# Patient Record
Sex: Female | Born: 2000 | Race: White | Hispanic: No | Marital: Single | Smoking: Never smoker
Health system: Southern US, Community
[De-identification: ages and names within clinical notes are randomized; demographics above are authoritative.]

## PROBLEM LIST (undated history)

## (undated) DIAGNOSIS — F32A Depression, unspecified: Secondary | ICD-10-CM

## (undated) DIAGNOSIS — F988 Other specified behavioral and emotional disorders with onset usually occurring in childhood and adolescence: Secondary | ICD-10-CM

## (undated) DIAGNOSIS — F419 Anxiety disorder, unspecified: Secondary | ICD-10-CM

## (undated) HISTORY — PX: TONSILLECTOMY: SUR1361

---
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
## 2020-07-04 ENCOUNTER — Emergency Department (HOSPITAL_COMMUNITY): Payer: BC Managed Care – PPO

## 2020-07-04 ENCOUNTER — Encounter (HOSPITAL_COMMUNITY): Payer: Self-pay

## 2020-07-04 ENCOUNTER — Emergency Department (HOSPITAL_COMMUNITY)
Admission: EM | Admit: 2020-07-04 | Discharge: 2020-07-04 | Disposition: A | Discharge status: Home / Self Care | Payer: BC Managed Care – PPO | Attending: Emergency Medicine | Admitting: Emergency Medicine

## 2020-07-04 DIAGNOSIS — Y9389 Activity, other specified: Secondary | ICD-10-CM | POA: Insufficient documentation

## 2020-07-04 DIAGNOSIS — J45909 Unspecified asthma, uncomplicated: Secondary | ICD-10-CM | POA: Diagnosis not present

## 2020-07-04 DIAGNOSIS — S82831A Other fracture of upper and lower end of right fibula, initial encounter for closed fracture: Secondary | ICD-10-CM | POA: Diagnosis not present

## 2020-07-04 DIAGNOSIS — M25561 Pain in right knee: Secondary | ICD-10-CM | POA: Diagnosis not present

## 2020-07-04 DIAGNOSIS — S8991XA Unspecified injury of right lower leg, initial encounter: Secondary | ICD-10-CM | POA: Diagnosis present

## 2020-07-04 DIAGNOSIS — W010XXA Fall on same level from slipping, tripping and stumbling without subsequent striking against object, initial encounter: Secondary | ICD-10-CM | POA: Insufficient documentation

## 2020-07-04 DIAGNOSIS — M25571 Pain in right ankle and joints of right foot: Secondary | ICD-10-CM | POA: Diagnosis not present

## 2020-07-04 DIAGNOSIS — R52 Pain, unspecified: Secondary | ICD-10-CM

## 2020-07-04 HISTORY — DX: Depression, unspecified: F32.A

## 2020-07-04 HISTORY — DX: Anxiety disorder, unspecified: F41.9

## 2020-07-04 HISTORY — DX: Other specified behavioral and emotional disorders with onset usually occurring in childhood and adolescence: F98.8

## 2020-07-04 MED ORDER — HYDROCODONE-ACETAMINOPHEN 5-325 MG PO TABS: 1.0000 | ORAL_TABLET | Freq: Four times a day (QID) | ORAL | 0 refills | Status: AC | PRN

## 2020-07-04 MED ORDER — ONDANSETRON HCL 4 MG/2ML IJ SOLN
4.0000 mg | Freq: Once | INTRAMUSCULAR | Status: DC
  Filled 2020-07-04: qty 2

## 2020-07-04 MED ORDER — FENTANYL CITRATE (PF) 100 MCG/2ML IJ SOLN
50.0000 ug | Freq: Once | INTRAMUSCULAR | Status: DC
  Filled 2020-07-04: qty 2

## 2020-07-04 MED ORDER — IBUPROFEN 600 MG PO TABS: 600.0000 mg | ORAL_TABLET | Freq: Four times a day (QID) | ORAL | 0 refills | Status: AC | PRN

## 2020-07-04 NOTE — Discharge Instructions (Signed)
You have a proximal fibular fracture, the rest of your x-rays are reassuring.  Please remain in knee immobilizer, and you should not bear any weight on the foot, use crutches.  Ice and elevate the leg.  Use ibuprofen 600 mg every 6 hours and Norco as needed every 6 hours for breakthrough pain.  Call tomorrow to schedule follow-up appointment with Dr. Ave Filter in [facility] orthopedics.

## 2020-07-04 NOTE — Progress Notes (Signed)
Orthopedic Tech Progress Note Patient Details:  Teresa Nicholson 12-02-2000 [id number]  Ortho Devices Type of Ortho Device: Knee Immobilizer, Crutches Ortho Device/Splint Location: Right Knee Ortho Device/Splint Interventions: Application, Adjustment   Post Interventions Patient Tolerated: Well Instructions Provided: Adjustment of device, Poper ambulation with device   Teresa Nicholson E Teresa Nicholson 07/04/2020, 5:04 AM
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
## 2021-02-17 ENCOUNTER — Other Ambulatory Visit: Payer: Self-pay | Admitting: Orthopaedic Surgery

## 2021-02-17 DIAGNOSIS — M25561 Pain in right knee: Secondary | ICD-10-CM

## 2021-02-20 ENCOUNTER — Ambulatory Visit
Admission: RE | Admit: 2021-02-20 | Discharge: 2021-02-20 | Disposition: A | Discharge status: Home / Self Care | Payer: BC Managed Care – PPO | Source: Ambulatory Visit | Attending: Orthopaedic Surgery | Admitting: Orthopaedic Surgery

## 2021-02-20 ENCOUNTER — Other Ambulatory Visit: Payer: Self-pay

## 2021-02-20 DIAGNOSIS — M25561 Pain in right knee: Secondary | ICD-10-CM

## 2021-02-24 ENCOUNTER — Other Ambulatory Visit: Payer: BC Managed Care – PPO

## 2021-11-28 IMAGING — CR DG FEMUR 2+V*R*
5 series · 5 of 5 positions shown · non-contrast
Comparison: None.

CLINICAL DATA: Recent fall with right hip pain, initial encounter

EXAM:
RIGHT FEMUR 2 VIEWS

[x femur proximal ap right (1 of 3)]
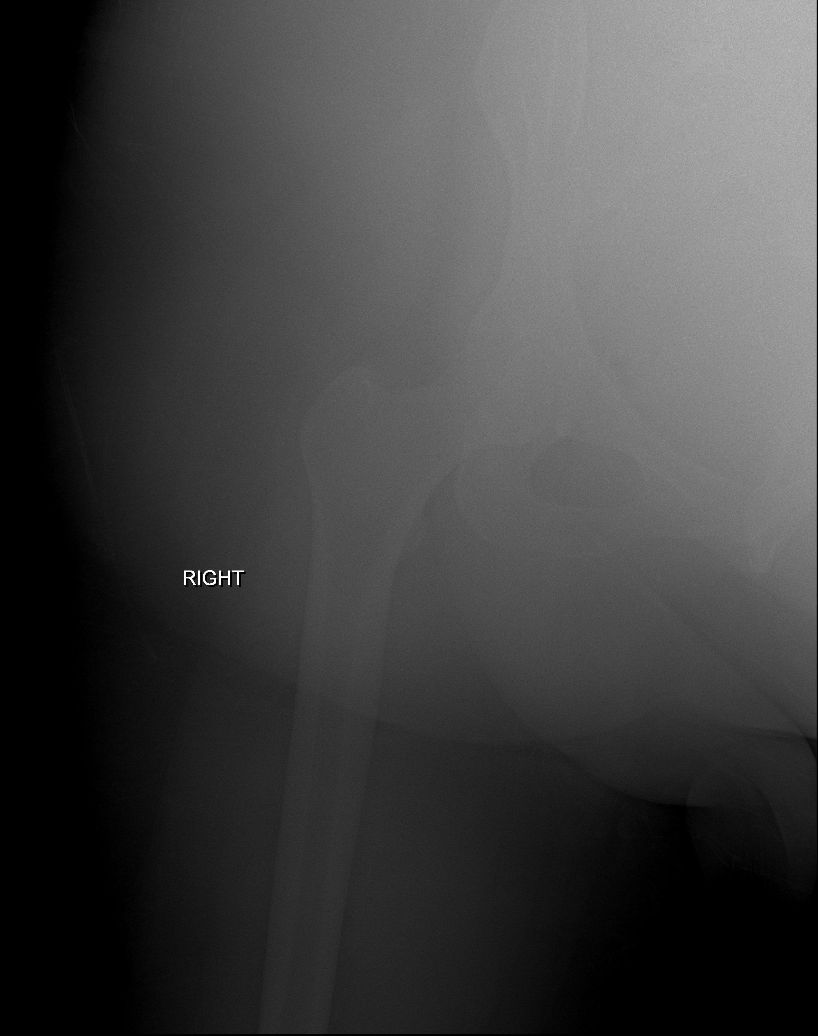

[x femur proximal ap right (2 of 3)]
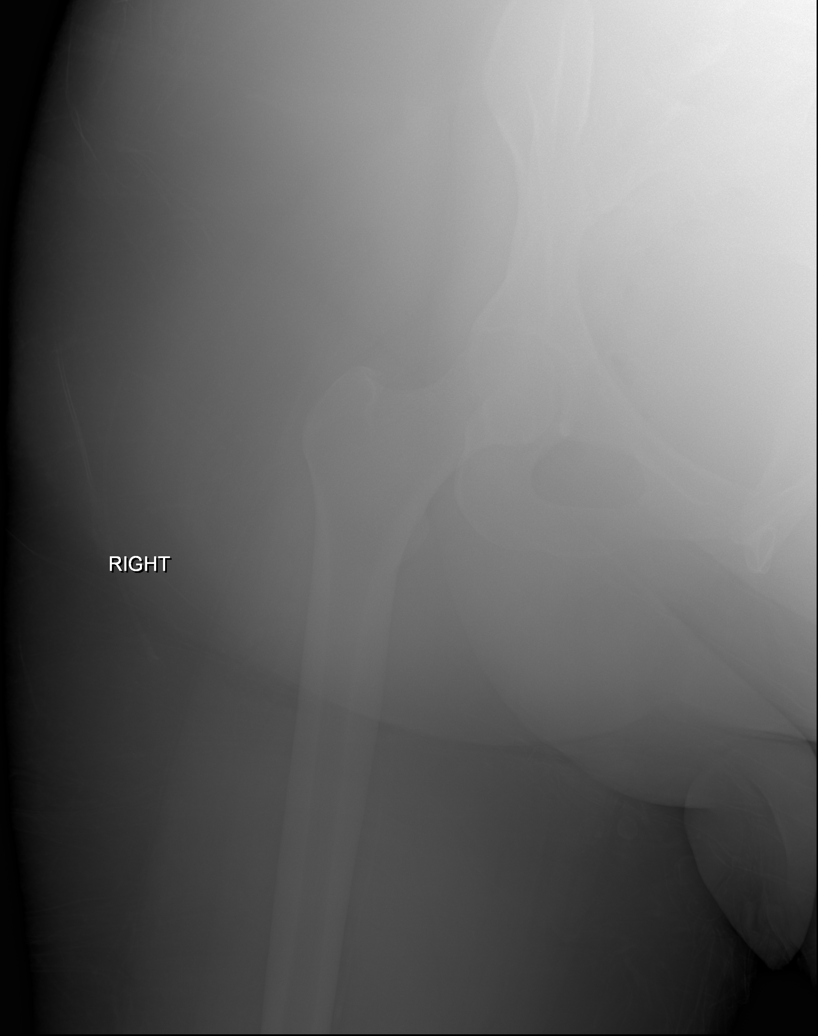

[x femur proximal ap right (3 of 3)]
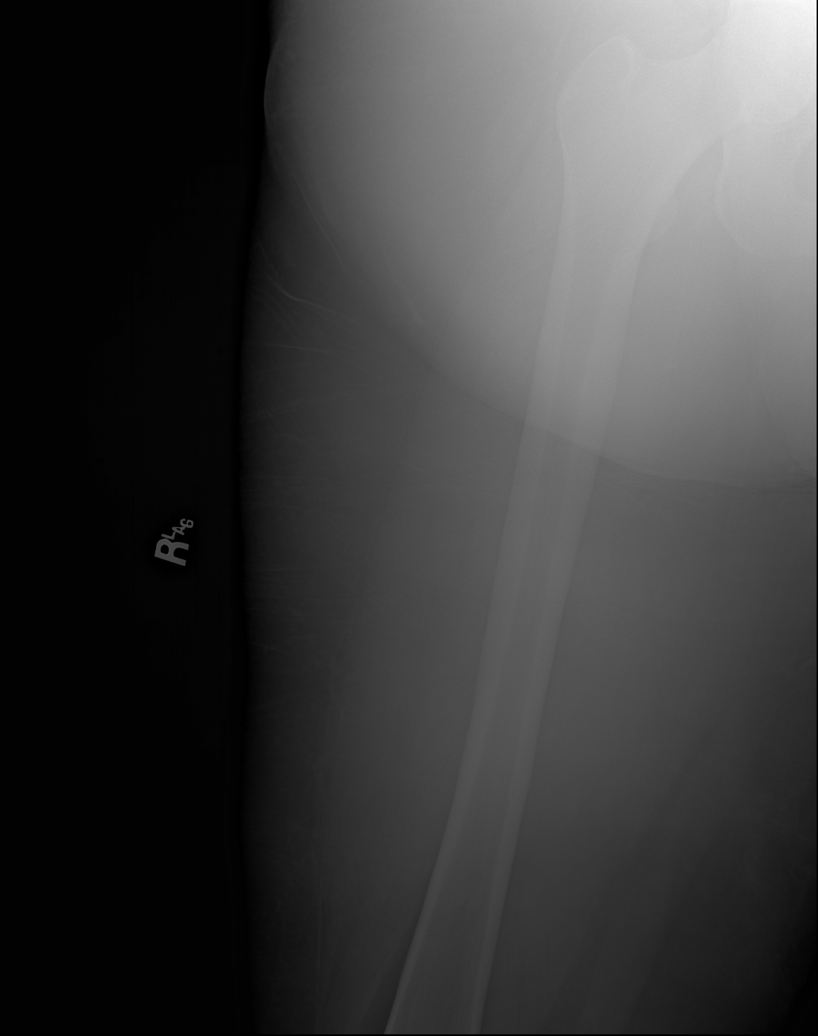

[w hip lat right]
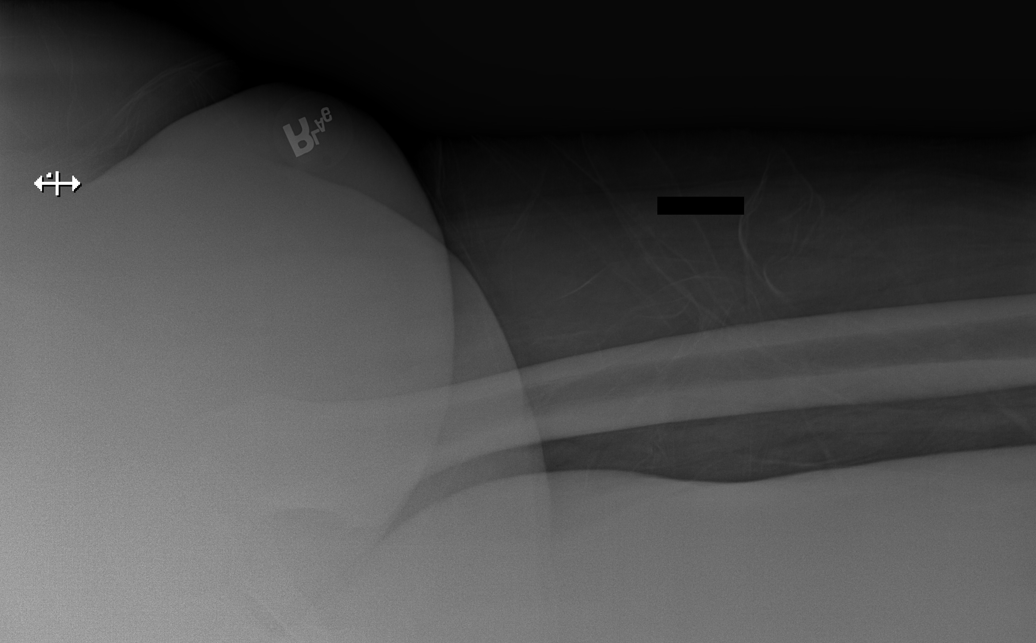

[x femur distal lat right]
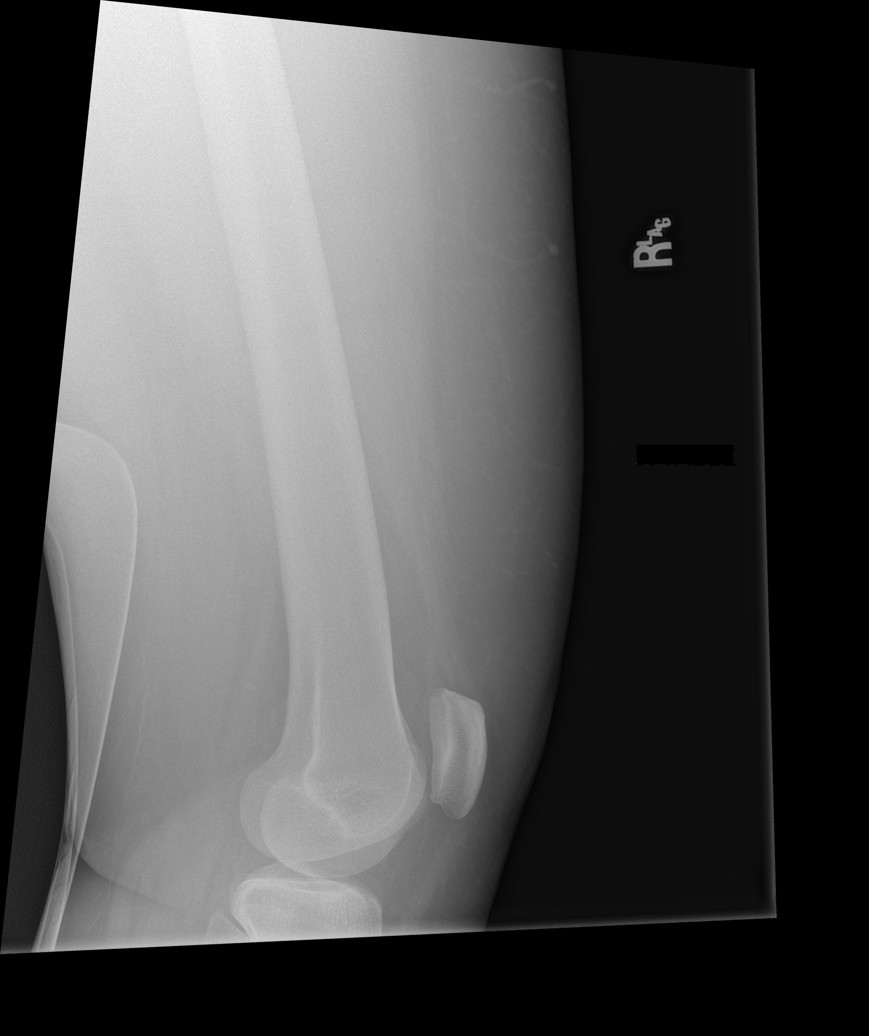

[5 of 5 positions shown; findings below may reference images not displayed]

FINDINGS: There is no evidence of fracture or other focal bone lesions. Soft
tissues are unremarkable.
IMPRESSION: No acute abnormality noted.

## 2021-11-28 IMAGING — CR DG ANKLE COMPLETE 3+V*R*
3 series · 3 of 3 positions shown · non-contrast
Comparison: None.

CLINICAL DATA: Recent trip and fall with right ankle pain, initial
encounter

EXAM:
RIGHT ANKLE - COMPLETE 3+ VIEW

[x ankle ap right]
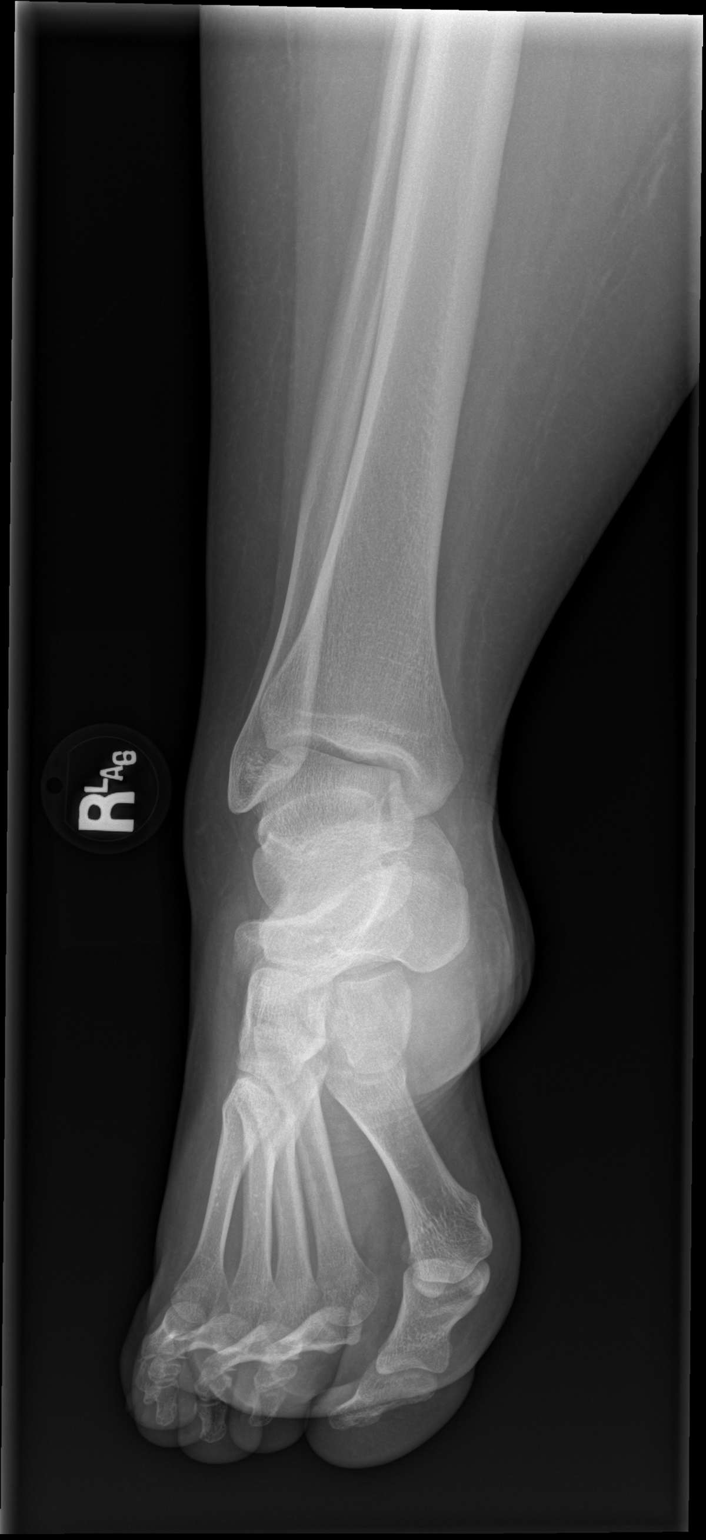

[x ankle obl right]
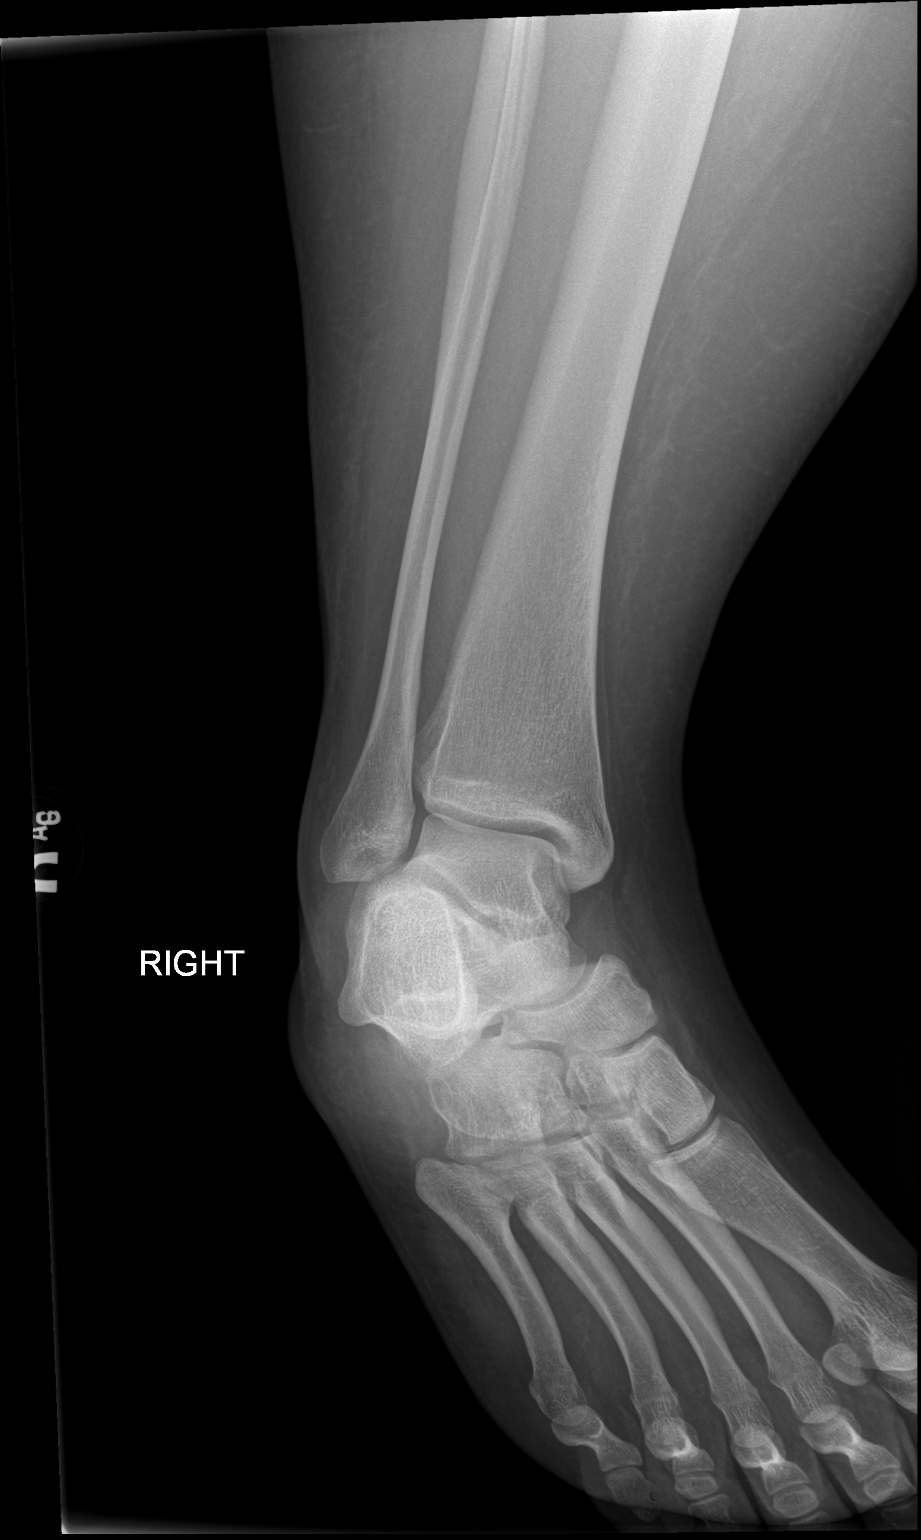

[x ankle lat right]
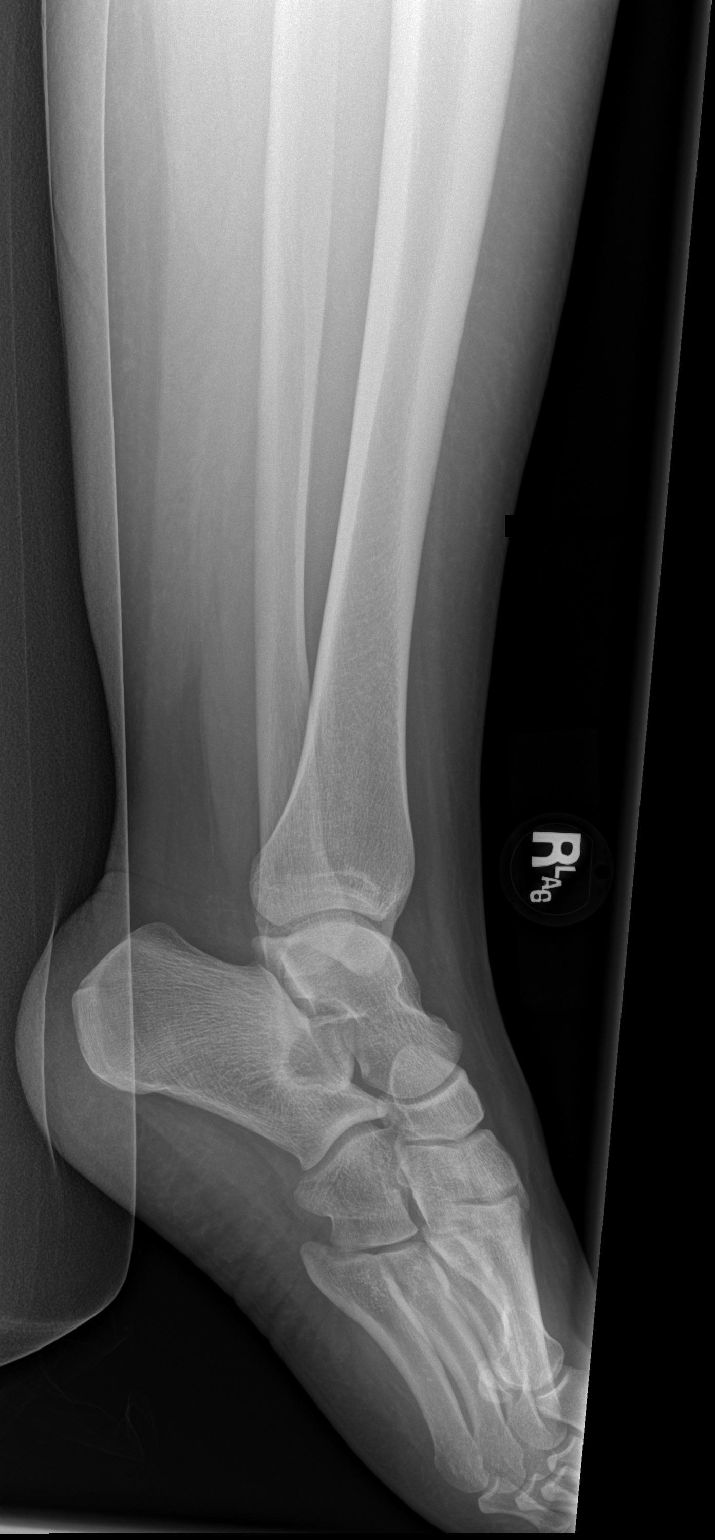

[3 of 3 positions shown; findings below may reference images not displayed]

FINDINGS: There is no evidence of fracture, dislocation, or joint effusion.
There is no evidence of arthropathy or other focal bone abnormality.
Soft tissues are unremarkable.
IMPRESSION: No acute abnormality noted.

## 2023-01-19 ENCOUNTER — Other Ambulatory Visit (HOSPITAL_BASED_OUTPATIENT_CLINIC_OR_DEPARTMENT_OTHER): Payer: Self-pay

## 2023-01-19 MED ORDER — ZEPBOUND 7.5 MG/0.5ML ~~LOC~~ SOAJ
7.5000 mg | SUBCUTANEOUS | 0 refills | Status: DC
  Filled 2023-01-19: qty 2, 28d supply, fill #0

## 2023-01-22 ENCOUNTER — Other Ambulatory Visit (HOSPITAL_BASED_OUTPATIENT_CLINIC_OR_DEPARTMENT_OTHER): Payer: Self-pay

## 2023-02-23 ENCOUNTER — Other Ambulatory Visit (HOSPITAL_BASED_OUTPATIENT_CLINIC_OR_DEPARTMENT_OTHER): Payer: Self-pay

## 2023-02-23 MED ORDER — ZEPBOUND 7.5 MG/0.5ML ~~LOC~~ SOAJ
7.5000 mg | SUBCUTANEOUS | 1 refills | Status: DC
  Filled 2023-02-23: qty 2, 28d supply, fill #0

## 2023-02-26 ENCOUNTER — Other Ambulatory Visit (HOSPITAL_BASED_OUTPATIENT_CLINIC_OR_DEPARTMENT_OTHER): Payer: Self-pay

## 2023-03-01 ENCOUNTER — Other Ambulatory Visit (HOSPITAL_BASED_OUTPATIENT_CLINIC_OR_DEPARTMENT_OTHER): Payer: Self-pay

## 2023-03-02 ENCOUNTER — Other Ambulatory Visit (HOSPITAL_BASED_OUTPATIENT_CLINIC_OR_DEPARTMENT_OTHER): Payer: Self-pay

## 2023-03-04 ENCOUNTER — Other Ambulatory Visit (HOSPITAL_BASED_OUTPATIENT_CLINIC_OR_DEPARTMENT_OTHER): Payer: Self-pay

## 2023-03-09 ENCOUNTER — Other Ambulatory Visit (HOSPITAL_BASED_OUTPATIENT_CLINIC_OR_DEPARTMENT_OTHER): Payer: Self-pay

## 2023-03-09 MED ORDER — ZEPBOUND 10 MG/0.5ML ~~LOC~~ SOAJ
10.0000 mg | SUBCUTANEOUS | 0 refills | Status: AC
  Filled 2023-03-09 – 2023-03-23 (×2): qty 2, 28d supply, fill #0

## 2023-03-10 ENCOUNTER — Other Ambulatory Visit (HOSPITAL_BASED_OUTPATIENT_CLINIC_OR_DEPARTMENT_OTHER): Payer: Self-pay

## 2023-03-23 ENCOUNTER — Other Ambulatory Visit (HOSPITAL_BASED_OUTPATIENT_CLINIC_OR_DEPARTMENT_OTHER): Payer: Self-pay

## 2023-03-24 ENCOUNTER — Other Ambulatory Visit (HOSPITAL_BASED_OUTPATIENT_CLINIC_OR_DEPARTMENT_OTHER): Payer: Self-pay

## 2023-03-25 ENCOUNTER — Other Ambulatory Visit (HOSPITAL_BASED_OUTPATIENT_CLINIC_OR_DEPARTMENT_OTHER): Payer: Self-pay

## 2023-03-25 ENCOUNTER — Other Ambulatory Visit: Payer: Self-pay

## 2023-03-26 ENCOUNTER — Other Ambulatory Visit (HOSPITAL_BASED_OUTPATIENT_CLINIC_OR_DEPARTMENT_OTHER): Payer: Self-pay

## 2023-03-29 ENCOUNTER — Other Ambulatory Visit (HOSPITAL_BASED_OUTPATIENT_CLINIC_OR_DEPARTMENT_OTHER): Payer: Self-pay

## 2023-03-30 ENCOUNTER — Other Ambulatory Visit (HOSPITAL_BASED_OUTPATIENT_CLINIC_OR_DEPARTMENT_OTHER): Payer: Self-pay

## 2023-04-06 ENCOUNTER — Other Ambulatory Visit (HOSPITAL_BASED_OUTPATIENT_CLINIC_OR_DEPARTMENT_OTHER): Payer: Self-pay

## 2023-04-07 ENCOUNTER — Other Ambulatory Visit (HOSPITAL_BASED_OUTPATIENT_CLINIC_OR_DEPARTMENT_OTHER): Payer: Self-pay

## 2023-04-13 ENCOUNTER — Other Ambulatory Visit (HOSPITAL_BASED_OUTPATIENT_CLINIC_OR_DEPARTMENT_OTHER): Payer: Self-pay

## 2023-04-14 ENCOUNTER — Other Ambulatory Visit (HOSPITAL_BASED_OUTPATIENT_CLINIC_OR_DEPARTMENT_OTHER): Payer: Self-pay

## 2023-04-15 ENCOUNTER — Other Ambulatory Visit (HOSPITAL_BASED_OUTPATIENT_CLINIC_OR_DEPARTMENT_OTHER): Payer: Self-pay

## 2023-04-19 ENCOUNTER — Other Ambulatory Visit (HOSPITAL_BASED_OUTPATIENT_CLINIC_OR_DEPARTMENT_OTHER): Payer: Self-pay

## 2023-04-20 ENCOUNTER — Other Ambulatory Visit (HOSPITAL_BASED_OUTPATIENT_CLINIC_OR_DEPARTMENT_OTHER): Payer: Self-pay

## 2023-04-22 ENCOUNTER — Other Ambulatory Visit (HOSPITAL_BASED_OUTPATIENT_CLINIC_OR_DEPARTMENT_OTHER): Payer: Self-pay

## 2023-04-23 ENCOUNTER — Other Ambulatory Visit (HOSPITAL_BASED_OUTPATIENT_CLINIC_OR_DEPARTMENT_OTHER): Payer: Self-pay

## 2023-04-30 ENCOUNTER — Other Ambulatory Visit (HOSPITAL_BASED_OUTPATIENT_CLINIC_OR_DEPARTMENT_OTHER): Payer: Self-pay

## 2023-05-03 ENCOUNTER — Other Ambulatory Visit (HOSPITAL_BASED_OUTPATIENT_CLINIC_OR_DEPARTMENT_OTHER): Payer: Self-pay

## 2023-05-04 ENCOUNTER — Other Ambulatory Visit (HOSPITAL_BASED_OUTPATIENT_CLINIC_OR_DEPARTMENT_OTHER): Payer: Self-pay

## 2023-05-07 ENCOUNTER — Other Ambulatory Visit (HOSPITAL_BASED_OUTPATIENT_CLINIC_OR_DEPARTMENT_OTHER): Payer: Self-pay

## 2023-05-10 ENCOUNTER — Other Ambulatory Visit (HOSPITAL_BASED_OUTPATIENT_CLINIC_OR_DEPARTMENT_OTHER): Payer: Self-pay

## 2023-05-11 ENCOUNTER — Other Ambulatory Visit (HOSPITAL_BASED_OUTPATIENT_CLINIC_OR_DEPARTMENT_OTHER): Payer: Self-pay
